# Patient Record
Sex: Female | Born: 1970 | Race: White | Hispanic: No | Marital: Married | Smoking: Never smoker
Health system: Southern US, Community
[De-identification: ages and names within clinical notes are randomized; demographics above are authoritative.]

## PROBLEM LIST (undated history)

## (undated) HISTORY — PX: AUGMENTATION MAMMAPLASTY: SUR837

---

## 2002-12-20 ENCOUNTER — Other Ambulatory Visit: Admission: RE | Admit: 2002-12-20 | Discharge: 2002-12-20 | Payer: Self-pay | Admitting: Obstetrics and Gynecology

## 2003-08-11 ENCOUNTER — Inpatient Hospital Stay (HOSPITAL_COMMUNITY): Admission: AD | Admit: 2003-08-11 | Discharge: 2003-08-12 | Payer: Self-pay | Admitting: Obstetrics and Gynecology

## 2003-08-12 ENCOUNTER — Inpatient Hospital Stay (HOSPITAL_COMMUNITY): Admission: AD | Admit: 2003-08-12 | Discharge: 2003-08-14 | Payer: Self-pay | Admitting: Obstetrics and Gynecology

## 2003-09-24 ENCOUNTER — Encounter: Admission: RE | Admit: 2003-09-24 | Discharge: 2003-10-24 | Payer: Self-pay | Admitting: Obstetrics and Gynecology

## 2003-10-25 ENCOUNTER — Encounter: Admission: RE | Admit: 2003-10-25 | Discharge: 2003-11-24 | Payer: Self-pay | Admitting: Obstetrics and Gynecology

## 2003-11-24 ENCOUNTER — Other Ambulatory Visit: Admission: RE | Admit: 2003-11-24 | Discharge: 2003-11-24 | Payer: Self-pay | Admitting: Obstetrics and Gynecology

## 2004-10-19 ENCOUNTER — Ambulatory Visit (HOSPITAL_COMMUNITY): Admission: RE | Admit: 2004-10-19 | Discharge: 2004-10-19 | Payer: Self-pay | Admitting: Obstetrics and Gynecology

## 2004-10-21 ENCOUNTER — Observation Stay (HOSPITAL_COMMUNITY): Admission: AD | Admit: 2004-10-21 | Discharge: 2004-10-22 | Payer: Self-pay | Admitting: Obstetrics and Gynecology

## 2004-12-15 ENCOUNTER — Other Ambulatory Visit: Admission: RE | Admit: 2004-12-15 | Discharge: 2004-12-15 | Payer: Self-pay | Admitting: Obstetrics and Gynecology

## 2005-11-09 ENCOUNTER — Inpatient Hospital Stay (HOSPITAL_COMMUNITY): Admission: AD | Admit: 2005-11-09 | Discharge: 2005-11-12 | Payer: Self-pay | Admitting: Obstetrics and Gynecology

## 2007-04-30 IMAGING — US US OB COMP +14 WK
1 series · 18 of 28 positions shown · non-contrast
Comparison: none

CLINICAL DATA: Abnormal prenatal screening panel.

[Series 1: us amniocentesis · 18 of 84 slices shown]
[im 1/84]
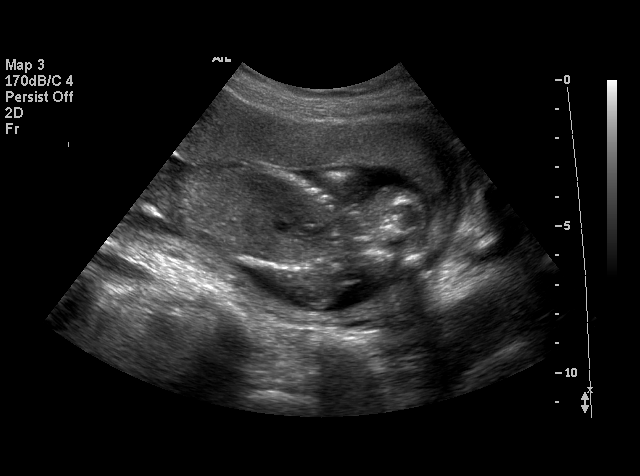
[im 7/84]
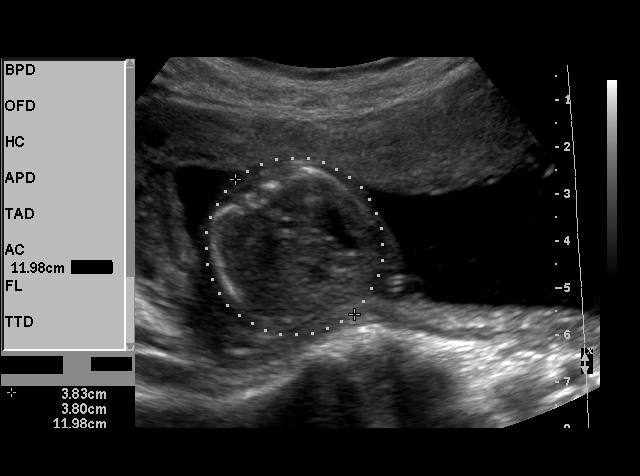
[im 10/84]
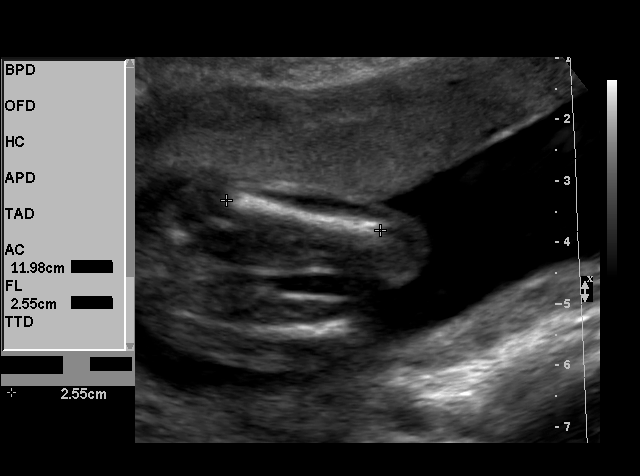
[im 16/84]
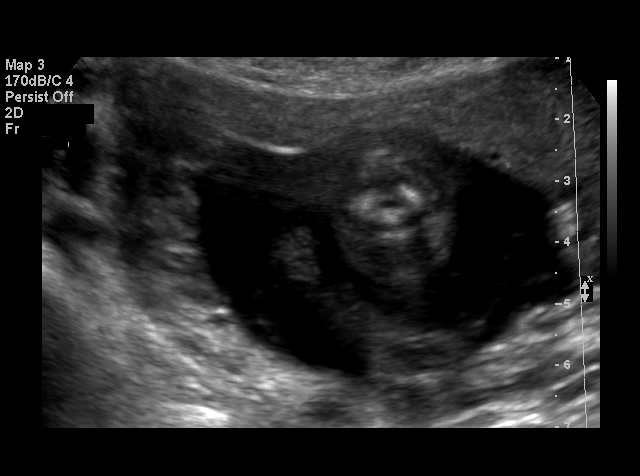
[im 22/84]
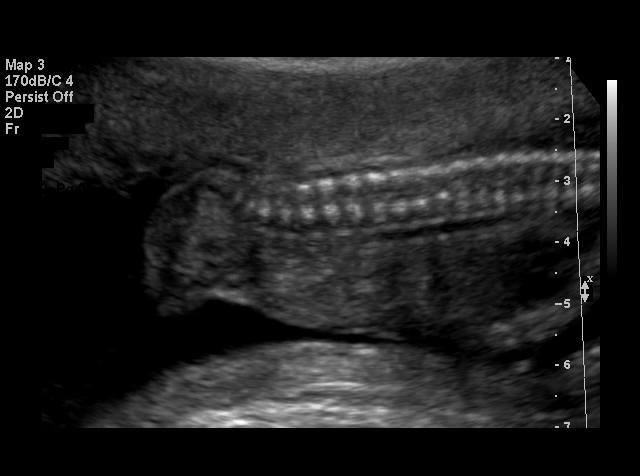
[im 25/84]
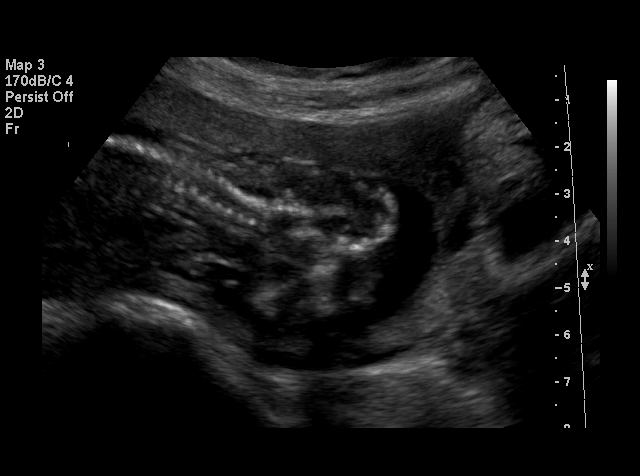
[im 31/84]
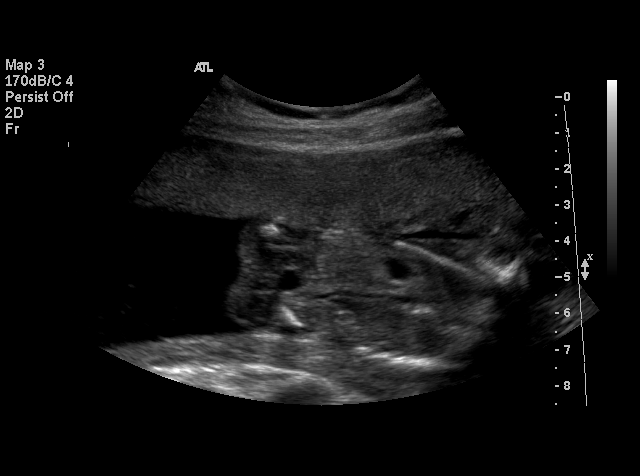
[im 34/84]
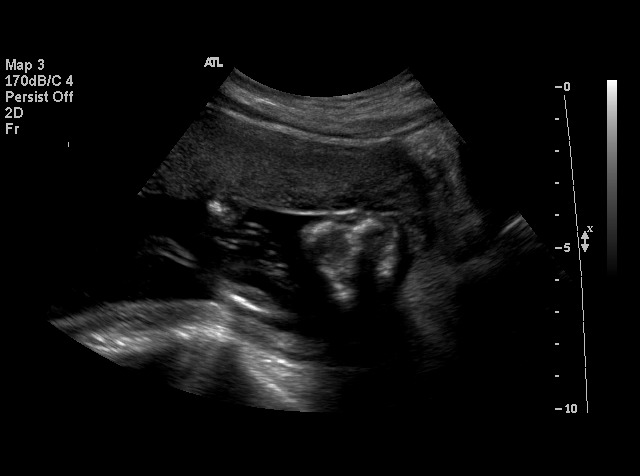
[im 40/84]
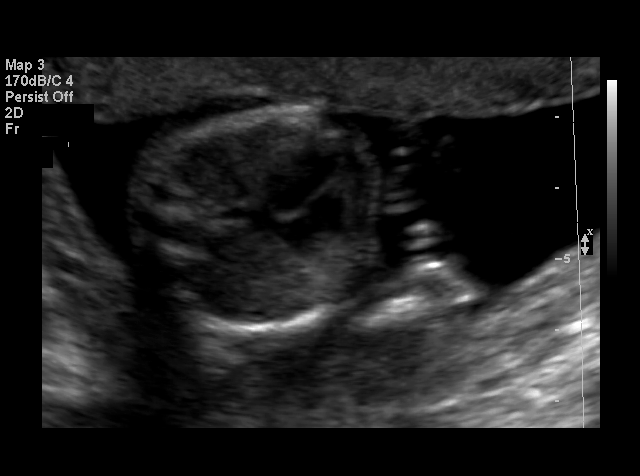
[im 44/84]
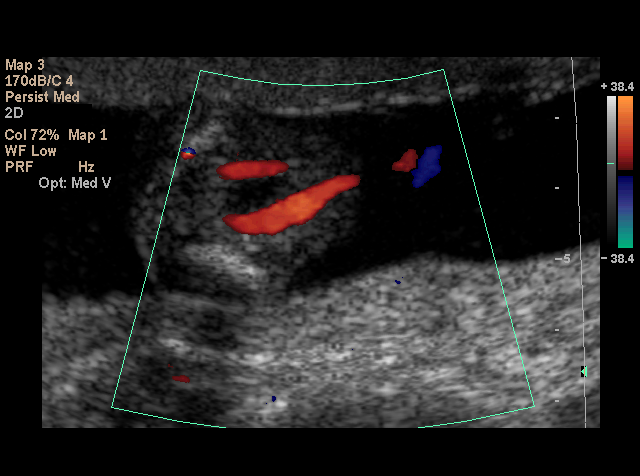
[im 50/84]
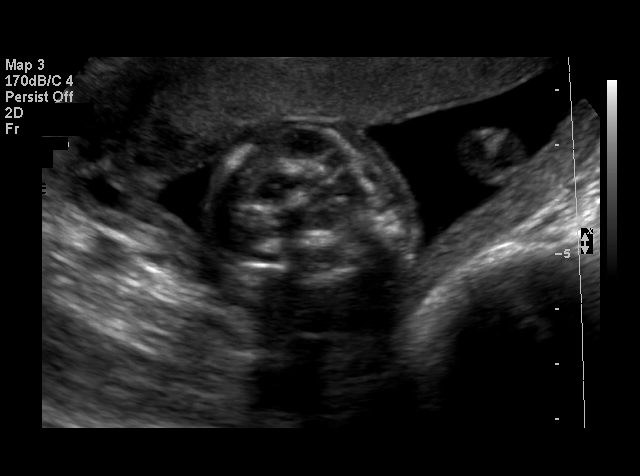
[im 53/84]
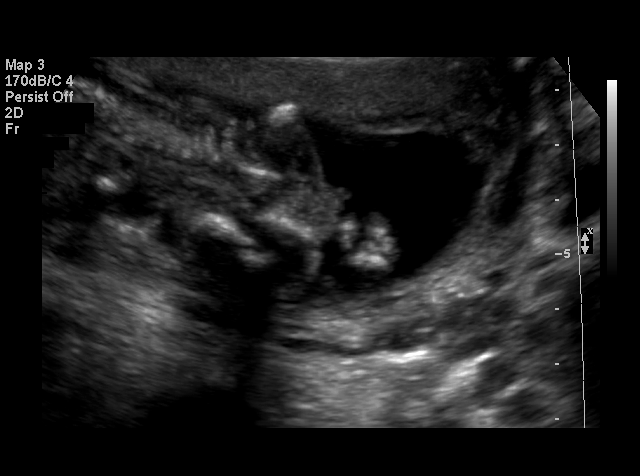
[im 59/84]
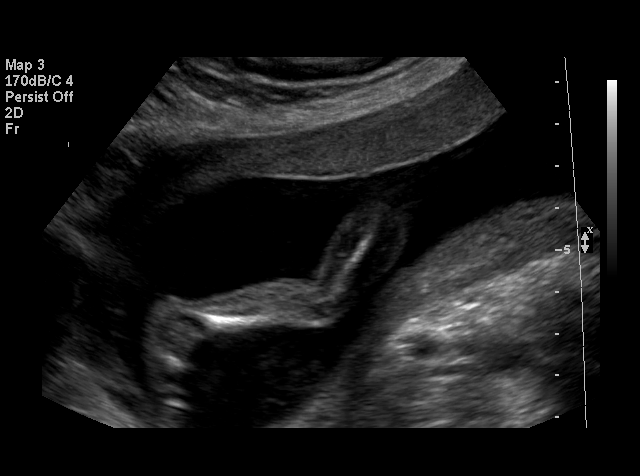
[im 65/84]
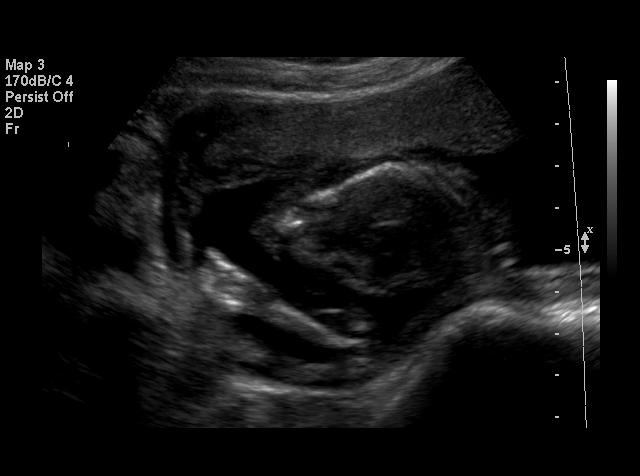
[im 68/84]
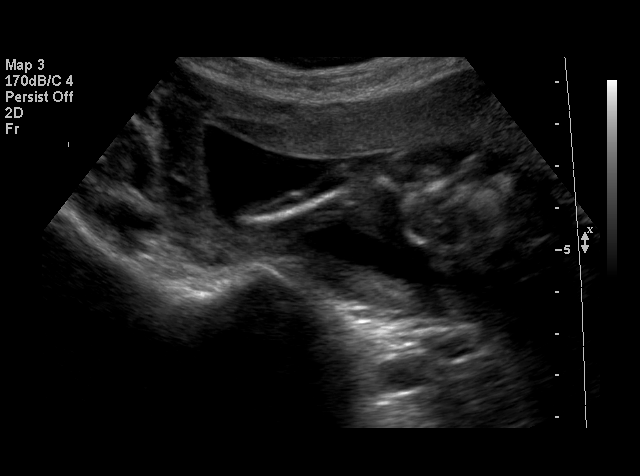
[im 74/84]
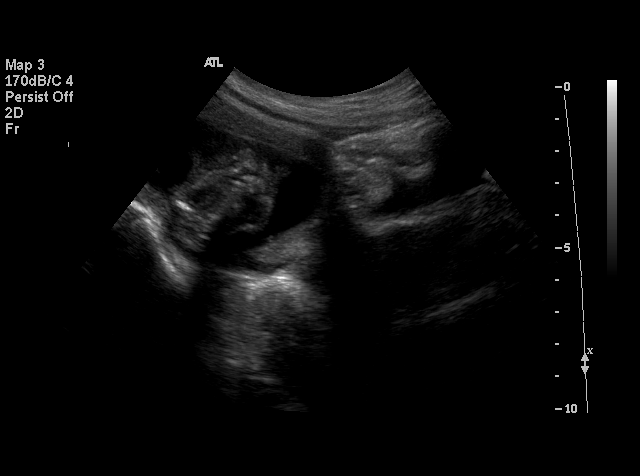
[im 77/84]
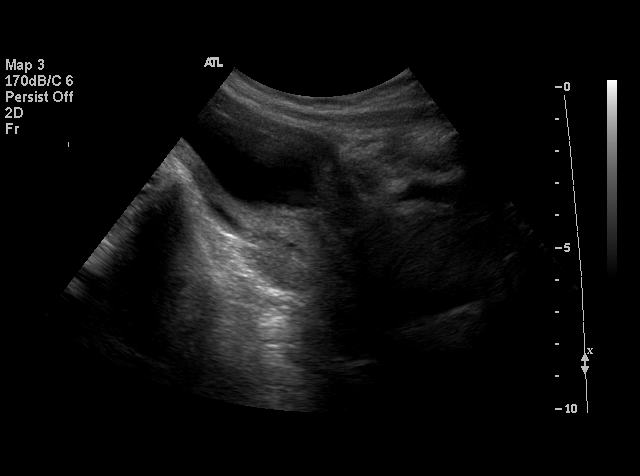
[im 84/84]
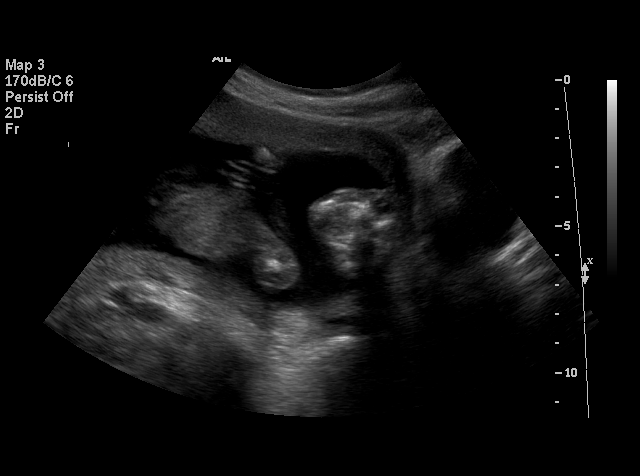

[18 of 28 positions shown; findings below may reference images not displayed]

OBSTETRICAL ULTRASOUND:
 Number of Fetuses:  1
 Heart Rate:  150
 Movement:  Yes
 Breathing:  No  
 Presentation:  Cephalic
 Placental Location:  Anterior
 Grade:  I
 Previa: No
 Amniotic Fluid (Subjective):  Normal
 Amniotic Fluid (Objective):   3.2 cm Vertical pocket 

 FETAL BIOMETRY
 AC:    12.1 cm  17 w 6 d
 FL:  2.6 cm  17 w 6 d

 MEAN GA:  17 w 6 d

 FETAL ANATOMY
 Lateral Ventricles:    Abnormal 
 Thalami/CSP:      Abnormal 
 Posterior Fossa:  Abnormal 
 Nuchal Region:    Not visualized 
 Spine:      Visualized 
 4 Chamber Heart on Left:      Visualized 
 Stomach on Left:      Visualized 
 3 Vessel Cord:    Visualized 
 Cord Insertion site:    Visualized 
 Kidneys:  Visualized 
 Bladder:  Visualized 
 Extremities:      Visualized 

 ADDITIONAL ANATOMY VISUALIZED:  Male genitalia.
 Comments:  Anencephaly is present.  

 MATERNAL UTERINE AND ADNEXAL FINDINGS
 Cervix:   4.5 cm Transabdominally
IMPRESSION: Single live intrauterine gestation with average ultrasound age of 17 weeks 5 days.  Anencephaly is noted.  No other fetal abnormality is seen.  Findings discussed with Dr. Jennifer Andrea by Dr. Dott and discussed with the patient at the time of the examination.

## 2010-03-14 ENCOUNTER — Encounter: Payer: Self-pay | Admitting: Obstetrics and Gynecology

## 2010-10-01 ENCOUNTER — Other Ambulatory Visit: Payer: Self-pay | Admitting: Obstetrics and Gynecology

## 2010-10-01 DIAGNOSIS — Z1231 Encounter for screening mammogram for malignant neoplasm of breast: Secondary | ICD-10-CM

## 2010-10-08 ENCOUNTER — Ambulatory Visit
Admission: RE | Admit: 2010-10-08 | Discharge: 2010-10-08 | Disposition: A | Discharge status: Home / Self Care | Payer: BC Managed Care – PPO | Source: Ambulatory Visit | Attending: Obstetrics and Gynecology | Admitting: Obstetrics and Gynecology

## 2010-10-08 DIAGNOSIS — Z1231 Encounter for screening mammogram for malignant neoplasm of breast: Secondary | ICD-10-CM

## 2011-10-27 ENCOUNTER — Other Ambulatory Visit: Payer: Self-pay | Admitting: Obstetrics and Gynecology

## 2011-10-27 DIAGNOSIS — Z1231 Encounter for screening mammogram for malignant neoplasm of breast: Secondary | ICD-10-CM

## 2011-11-22 ENCOUNTER — Ambulatory Visit
Admission: RE | Admit: 2011-11-22 | Discharge: 2011-11-22 | Disposition: A | Discharge status: Home / Self Care | Payer: BC Managed Care – PPO | Source: Ambulatory Visit | Attending: Obstetrics and Gynecology | Admitting: Obstetrics and Gynecology

## 2011-11-22 DIAGNOSIS — Z1231 Encounter for screening mammogram for malignant neoplasm of breast: Secondary | ICD-10-CM

## 2014-12-30 ENCOUNTER — Ambulatory Visit (INDEPENDENT_AMBULATORY_CARE_PROVIDER_SITE_OTHER): Payer: BLUE CROSS/BLUE SHIELD | Admitting: Podiatry

## 2014-12-30 ENCOUNTER — Encounter: Payer: Self-pay | Admitting: Podiatry

## 2014-12-30 ENCOUNTER — Ambulatory Visit (INDEPENDENT_AMBULATORY_CARE_PROVIDER_SITE_OTHER): Payer: BLUE CROSS/BLUE SHIELD

## 2014-12-30 VITALS — BP 91/67 | HR 81 | Resp 16 | Ht 65.0 in | Wt 142.0 lb

## 2014-12-30 DIAGNOSIS — M722 Plantar fascial fibromatosis: Secondary | ICD-10-CM

## 2014-12-30 MED ORDER — METHYLPREDNISOLONE 4 MG PO TBPK: ORAL_TABLET | ORAL | Status: DC

## 2014-12-30 MED ORDER — MELOXICAM 15 MG PO TABS: 15.0000 mg | ORAL_TABLET | Freq: Every day | ORAL | Status: AC

## 2014-12-30 NOTE — Patient Instructions (Signed)

## 2014-12-30 NOTE — Progress Notes (Signed)
   Subjective:    Patient ID: Heather CosierRhonda Rowe, female    DOB: 12/05/1970, 44 y.o.   MRN: [id number]  HPI: She presents today with a year duration of pain to the plantar aspect of the right heel. She states that she thinks is plantar fasciitis and she had plantar fasciitis 15 years ago. She states that she has discontinued all physical activities and thinks that she has had a reoccurrence of the fasciitis due to increased activity on concrete at work.  Review of Systems  All other systems reviewed and are negative.      Objective:   Physical Exam: 44 year old female vital signs stable alert and oriented 3 in no apparent distress pulses are strongly palpable neurologic sensorium is intact per Semmes-Weinstein monofilament. Deep tendon reflexes are intact. And muscle strength +5 over 5 dorsiflexion plantar flexors and inverters everters all into the musculature is intact. Orthopedic evaluation demonstrates pain on palpation medial calcaneal tubercle of the right heel. Radiographs confirm plantar distally oriented calcaneal heel spur with soft tissue increase in density at the plantar fascial calcaneal insertion site right foot. Radiographs taken 3 views in the office today. Cutaneous evaluation of a straight supple well-hydrated cutis no erythema edema cellulitis drainage or odor.        Assessment & Plan:  Plantar fasciitis right heel.  Plan: Discussed etiology pathology conservative versus surgical therapies. Discussed the possible need for orthotics. Injected the right heel today with Kenalog and local anesthetic. Placed her in a plantar fascial brace and a night splint. Start her on a Medrol Dosepak to be followed by meloxicam. Follow up with her in 1 month.

## 2015-01-27 ENCOUNTER — Encounter: Payer: Self-pay | Admitting: Podiatry

## 2015-01-27 ENCOUNTER — Ambulatory Visit (INDEPENDENT_AMBULATORY_CARE_PROVIDER_SITE_OTHER): Payer: BLUE CROSS/BLUE SHIELD | Admitting: Podiatry

## 2015-01-27 VITALS — BP 131/87 | HR 67 | Resp 16

## 2015-01-27 DIAGNOSIS — M722 Plantar fascial fibromatosis: Secondary | ICD-10-CM

## 2015-01-27 NOTE — Progress Notes (Signed)
She presents today for follow-up of plantar fasciitis to the right foot. She states that she is approximately 90% better and has performed all of the duties of which we have asked her. This includes taking meloxicam on a regular basis wearing the plantar fascial brace stretching exercises and utilizing the plantar fascial splint. Appropriate shoe gear. She states that she has had 90% and seems to have plateaued.  Objective: Vital signs are stable she is alert and oriented 3. Pulses are strongly palpable. She has pain on palpation medial calcaneal tubercle of the right heel the much less than previously noted.  Assessment: Plantar fasciitis residual in nature right foot.  Plan: Discussed etiology pathology conservative versus surgical therapies. Injected the right heel today with Kenalog and local anesthetic and she should continue all conservative therapies. Should this reoccur are not completely resolved and we will have to order a set of orthotics.

## 2015-02-24 ENCOUNTER — Ambulatory Visit: Payer: BLUE CROSS/BLUE SHIELD | Admitting: Podiatry

## 2015-03-12 ENCOUNTER — Encounter: Payer: Self-pay | Admitting: Podiatry

## 2015-03-12 ENCOUNTER — Ambulatory Visit (INDEPENDENT_AMBULATORY_CARE_PROVIDER_SITE_OTHER): Payer: BLUE CROSS/BLUE SHIELD | Admitting: Podiatry

## 2015-03-12 VITALS — BP 129/88 | HR 66 | Resp 12

## 2015-03-12 DIAGNOSIS — M722 Plantar fascial fibromatosis: Secondary | ICD-10-CM | POA: Diagnosis not present

## 2015-03-14 NOTE — Progress Notes (Signed)
She presents today for follow-up of her plantar fasciitis of the right foot. She states that she has continued all conservative therapies including the night brace today brace and her meloxicam. She states that she is doing much much better.  Objective: Vital signs are stable she is alert and oriented 3. Pulses are palpable. She has no calf pain. She has no pain on palpation medially continued tubercle of the right heel.  Assessment: Resolving plantar fasciitis right foot.  Plan: I encouraged her to continue all conservative therapies listed above for an additional month after she is 100% improved. Follow-up with me as needed.

## 2015-04-30 ENCOUNTER — Ambulatory Visit (INDEPENDENT_AMBULATORY_CARE_PROVIDER_SITE_OTHER): Payer: BLUE CROSS/BLUE SHIELD | Admitting: Podiatry

## 2015-04-30 ENCOUNTER — Encounter: Payer: Self-pay | Admitting: Podiatry

## 2015-04-30 VITALS — BP 108/68 | HR 81 | Resp 12

## 2015-04-30 DIAGNOSIS — M722 Plantar fascial fibromatosis: Secondary | ICD-10-CM | POA: Diagnosis not present

## 2015-04-30 MED ORDER — MELOXICAM 15 MG PO TABS: 15.0000 mg | ORAL_TABLET | Freq: Every day | ORAL | Status: AC

## 2015-05-03 NOTE — Progress Notes (Signed)
She presents today for follow-up of her plantar fasciitis right heel. She states it is still sore.  Objective: Vital signs are stable she is alert and oriented 3. Pulses are palpable. She has no calf pain. She has pain on palpation medial calcaneal tubercle of the right heel.  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  Plan: She was scanned for set of orthotics today and I reinjected her right heel. She will continue with all other conservative therapies.

## 2015-05-12 ENCOUNTER — Ambulatory Visit: Payer: BLUE CROSS/BLUE SHIELD | Admitting: Podiatry

## 2015-05-26 ENCOUNTER — Ambulatory Visit: Payer: BLUE CROSS/BLUE SHIELD | Admitting: *Deleted

## 2015-05-26 DIAGNOSIS — M722 Plantar fascial fibromatosis: Secondary | ICD-10-CM

## 2015-05-26 NOTE — Progress Notes (Signed)
Patient ID: Heather CosierRhonda Rowe, female   DOB: 01/27/1971, 45 y.o.   MRN: [id number] Patient presents for orthotic pick up.  Verbal and written break in and wear instructions given.  Patient will follow up in 4 weeks if symptoms worsen or fail to improve.

## 2015-05-26 NOTE — Patient Instructions (Signed)

## 2017-02-21 HISTORY — PX: OTHER SURGICAL HISTORY: SHX169

## 2019-04-27 ENCOUNTER — Ambulatory Visit: Payer: Self-pay | Attending: Internal Medicine

## 2019-04-27 DIAGNOSIS — Z23 Encounter for immunization: Secondary | ICD-10-CM | POA: Insufficient documentation

## 2019-04-27 NOTE — Progress Notes (Signed)
   Covid-19 Vaccination Clinic  Name:  Heather Rowe    MRN: [id number] DOB: [birth year]  04/27/2019  Heather Rowe was observed post Covid-19 immunization for 15 minutes without incident. She was provided with Vaccine Information Sheet and instruction to access the V-Safe system.   Heather Rowe was instructed to call 911 with any severe reactions post vaccine: Marland Kitchen Difficulty breathing  . Swelling of face and throat  . A fast heartbeat  . A bad rash all over body  . Dizziness and weakness   Immunizations Administered    Name Date Dose VIS Date Route   Pfizer COVID-19 Vaccine 04/27/2019  9:12 AM 0.3 mL 02/01/2019 Intramuscular   Manufacturer: [employer], [employer]   Lot: [id number]   NDC: [address]-1

## 2019-05-18 ENCOUNTER — Ambulatory Visit: Payer: Self-pay | Attending: Internal Medicine

## 2019-05-18 DIAGNOSIS — Z23 Encounter for immunization: Secondary | ICD-10-CM

## 2019-05-18 NOTE — Progress Notes (Signed)
   Covid-19 Vaccination Clinic  Name:  Heather Rowe    MRN: [id number] DOB: [birth year]  05/18/2019  [name] was observed post Covid-19 immunization for 15 minutes without incident. She was provided with Vaccine Information Sheet and instruction to access the V-Safe system.   [name] was instructed to call 911 with any severe reactions post vaccine: Marland Kitchen Difficulty breathing  . Swelling of face and throat  . A fast heartbeat  . A bad rash all over body  . Dizziness and weakness   Immunizations Administered    Name Date Dose VIS Date Route   Pfizer COVID-19 Vaccine 05/18/2019  8:14 AM 0.3 mL 02/01/2019 Intramuscular   Manufacturer: [employer], [employer]   Lot: [id number]   NDC: [address]-2

## 2022-04-13 DIAGNOSIS — Z5181 Encounter for therapeutic drug level monitoring: Secondary | ICD-10-CM | POA: Diagnosis not present

## 2022-04-13 DIAGNOSIS — Z87898 Personal history of other specified conditions: Secondary | ICD-10-CM | POA: Diagnosis not present

## 2022-12-21 DIAGNOSIS — S82832A Other fracture of upper and lower end of left fibula, initial encounter for closed fracture: Secondary | ICD-10-CM | POA: Diagnosis not present

## 2022-12-21 DIAGNOSIS — M25572 Pain in left ankle and joints of left foot: Secondary | ICD-10-CM | POA: Diagnosis not present

## 2023-06-12 DIAGNOSIS — Z01419 Encounter for gynecological examination (general) (routine) without abnormal findings: Secondary | ICD-10-CM | POA: Diagnosis not present

## 2023-06-12 DIAGNOSIS — Z1231 Encounter for screening mammogram for malignant neoplasm of breast: Secondary | ICD-10-CM | POA: Diagnosis not present

## 2023-06-12 DIAGNOSIS — Z1331 Encounter for screening for depression: Secondary | ICD-10-CM | POA: Diagnosis not present

## 2023-06-12 DIAGNOSIS — Z124 Encounter for screening for malignant neoplasm of cervix: Secondary | ICD-10-CM | POA: Diagnosis not present

## 2023-06-12 DIAGNOSIS — Z01411 Encounter for gynecological examination (general) (routine) with abnormal findings: Secondary | ICD-10-CM | POA: Diagnosis not present

## 2023-06-12 DIAGNOSIS — Z113 Encounter for screening for infections with a predominantly sexual mode of transmission: Secondary | ICD-10-CM | POA: Diagnosis not present

## 2023-06-14 ENCOUNTER — Other Ambulatory Visit: Payer: Self-pay | Admitting: Obstetrics and Gynecology

## 2023-06-14 DIAGNOSIS — N6489 Other specified disorders of breast: Secondary | ICD-10-CM

## 2023-06-14 DIAGNOSIS — R928 Other abnormal and inconclusive findings on diagnostic imaging of breast: Secondary | ICD-10-CM

## 2023-06-16 DIAGNOSIS — Z1212 Encounter for screening for malignant neoplasm of rectum: Secondary | ICD-10-CM | POA: Diagnosis not present

## 2023-06-16 DIAGNOSIS — Z1211 Encounter for screening for malignant neoplasm of colon: Secondary | ICD-10-CM | POA: Diagnosis not present

## 2023-06-22 LAB — EXTERNAL GENERIC LAB PROCEDURE: COLOGUARD: NEGATIVE

## 2023-06-26 ENCOUNTER — Ambulatory Visit
Admission: RE | Admit: 2023-06-26 | Discharge: 2023-06-26 | Disposition: A | Discharge status: Home / Self Care | Payer: Self-pay | Source: Ambulatory Visit | Attending: Obstetrics and Gynecology | Admitting: Obstetrics and Gynecology

## 2023-06-26 DIAGNOSIS — R928 Other abnormal and inconclusive findings on diagnostic imaging of breast: Secondary | ICD-10-CM

## 2023-06-26 DIAGNOSIS — N6489 Other specified disorders of breast: Secondary | ICD-10-CM

## 2023-07-03 ENCOUNTER — Other Ambulatory Visit: Payer: Self-pay | Admitting: Obstetrics and Gynecology

## 2023-07-03 DIAGNOSIS — N6489 Other specified disorders of breast: Secondary | ICD-10-CM

## 2024-01-15 DIAGNOSIS — Z5181 Encounter for therapeutic drug level monitoring: Secondary | ICD-10-CM | POA: Diagnosis not present

## 2024-01-15 DIAGNOSIS — Z13220 Encounter for screening for lipoid disorders: Secondary | ICD-10-CM | POA: Diagnosis not present

## 2024-01-15 DIAGNOSIS — Z1159 Encounter for screening for other viral diseases: Secondary | ICD-10-CM | POA: Diagnosis not present

## 2024-01-15 DIAGNOSIS — Z0000 Encounter for general adult medical examination without abnormal findings: Secondary | ICD-10-CM | POA: Diagnosis not present
